# Patient Record
Sex: Female | Born: 1990 | Race: Black or African American | Hispanic: No | Marital: Single | Smoking: Current some day smoker
Health system: Southern US, Community
[De-identification: ages and names within clinical notes are randomized; demographics above are authoritative.]

---

## 2011-01-05 ENCOUNTER — Other Ambulatory Visit (HOSPITAL_COMMUNITY): Payer: Self-pay | Admitting: Family Medicine

## 2011-01-05 ENCOUNTER — Inpatient Hospital Stay (INDEPENDENT_AMBULATORY_CARE_PROVIDER_SITE_OTHER)
Admission: RE | Admit: 2011-01-05 | Discharge: 2011-01-05 | Disposition: A | Discharge status: Home / Self Care | Payer: Self-pay | Source: Ambulatory Visit | Attending: Emergency Medicine | Admitting: Emergency Medicine

## 2011-01-05 DIAGNOSIS — N760 Acute vaginitis: Secondary | ICD-10-CM

## 2011-01-05 LAB — WET PREP, GENITAL

## 2011-01-09 LAB — POCT PREGNANCY, URINE: Preg Test, Ur: NEGATIVE

## 2011-01-09 LAB — POCT URINALYSIS DIP (DEVICE)
Bilirubin Urine: NEGATIVE
Glucose, UA: NEGATIVE mg/dL
Nitrite: NEGATIVE
Specific Gravity, Urine: 1.015 (ref 1.005–1.030)
Urobilinogen, UA: 1 mg/dL (ref 0.0–1.0)

## 2011-06-27 ENCOUNTER — Inpatient Hospital Stay (INDEPENDENT_AMBULATORY_CARE_PROVIDER_SITE_OTHER)
Admission: RE | Admit: 2011-06-27 | Discharge: 2011-06-27 | Disposition: A | Discharge status: Home / Self Care | Payer: Medicaid Other | Source: Ambulatory Visit | Attending: Family Medicine | Admitting: Family Medicine

## 2011-06-27 DIAGNOSIS — B009 Herpesviral infection, unspecified: Secondary | ICD-10-CM

## 2011-06-27 DIAGNOSIS — N760 Acute vaginitis: Secondary | ICD-10-CM

## 2011-06-27 LAB — WET PREP, GENITAL
Trich, Wet Prep: NONE SEEN
Yeast Wet Prep HPF POC: NONE SEEN

## 2011-08-13 ENCOUNTER — Emergency Department (INDEPENDENT_AMBULATORY_CARE_PROVIDER_SITE_OTHER)
Admission: EM | Admit: 2011-08-13 | Discharge: 2011-08-13 | Disposition: A | Discharge status: Home / Self Care | Payer: Medicaid Other | Source: Home / Self Care | Attending: Family Medicine | Admitting: Family Medicine

## 2011-08-13 ENCOUNTER — Encounter: Payer: Self-pay | Admitting: *Deleted

## 2011-08-13 DIAGNOSIS — R21 Rash and other nonspecific skin eruption: Secondary | ICD-10-CM

## 2011-08-13 NOTE — ED Provider Notes (Signed)
History     CSN: [id number] Arrival date & time: 08/13/2011  1:53 PM   First MD Initiated Contact with Patient 08/13/11 1408      Chief Complaint  Patient presents with  . Rash    (Consider location/radiation/quality/duration/timing/severity/associated sxs/prior treatment) Patient is a 20 y.o. female presenting with rash. The history is provided by the patient.  Rash  This is a new problem. The current episode started 2 days ago. The problem has not changed since onset.The problem is associated with nothing. The rash is present on the right arm and left arm. The pain is at a severity of 0/10. Associated symptoms include itching. She has tried nothing for the symptoms.    History reviewed. No pertinent past medical history.  History reviewed. No pertinent past surgical history.  No family history on file.  History  Substance Use Topics  . Smoking status: Not on file  . Smokeless tobacco: Not on file  . Alcohol Use: Not on file    OB History    Grav Para Term Preterm Abortions TAB SAB Ect Mult Living                  Review of Systems  Constitutional: Negative.   Respiratory: Negative.   Cardiovascular: Negative.   Skin: Positive for itching and rash.    Allergies  Review of patient's allergies indicates no known allergies.  Home Medications   Current Outpatient Rx  Name Route Sig Dispense Refill  . MULTI-VITAMIN/MINERALS PO TABS Oral Take 1 tablet by mouth daily.        BP 115/69  Pulse 57  Temp(Src) 98.9 F (37.2 C) (Oral)  Resp 16  SpO2 100%  LMP 07/24/2011  Physical Exam  Constitutional: She appears well-developed and well-nourished.  Cardiovascular: Normal rate and regular rhythm.   Pulmonary/Chest: Breath sounds normal.  Skin: Skin is warm and dry.       Few small areas of dry skin    ED Course  Procedures (including critical care time)  Labs Reviewed - No data to display No results found.   No diagnosis found.    MDM           Randa Spike, MD 08/13/11 [phone]

## 2012-02-14 ENCOUNTER — Emergency Department (HOSPITAL_COMMUNITY)
Admission: EM | Admit: 2012-02-14 | Discharge: 2012-02-14 | Disposition: A | Discharge status: Home / Self Care | Payer: Medicaid Other | Source: Home / Self Care | Attending: Emergency Medicine | Admitting: Emergency Medicine

## 2013-10-27 ENCOUNTER — Emergency Department (HOSPITAL_COMMUNITY)
Admission: EM | Admit: 2013-10-27 | Discharge: 2013-10-27 | Disposition: A | Discharge status: Home / Self Care | Payer: BC Managed Care – PPO | Attending: Emergency Medicine | Admitting: Emergency Medicine

## 2013-10-27 ENCOUNTER — Encounter (HOSPITAL_COMMUNITY): Payer: Self-pay | Admitting: Emergency Medicine

## 2013-10-27 ENCOUNTER — Emergency Department (HOSPITAL_COMMUNITY): Payer: BC Managed Care – PPO

## 2013-10-27 DIAGNOSIS — R079 Chest pain, unspecified: Secondary | ICD-10-CM

## 2013-10-27 DIAGNOSIS — R071 Chest pain on breathing: Secondary | ICD-10-CM | POA: Insufficient documentation

## 2013-10-27 DIAGNOSIS — IMO0002 Reserved for concepts with insufficient information to code with codable children: Secondary | ICD-10-CM | POA: Insufficient documentation

## 2013-10-27 DIAGNOSIS — F17200 Nicotine dependence, unspecified, uncomplicated: Secondary | ICD-10-CM | POA: Insufficient documentation

## 2013-10-27 DIAGNOSIS — M542 Cervicalgia: Secondary | ICD-10-CM | POA: Insufficient documentation

## 2013-10-27 DIAGNOSIS — Y92410 Unspecified street and highway as the place of occurrence of the external cause: Secondary | ICD-10-CM | POA: Insufficient documentation

## 2013-10-27 DIAGNOSIS — Y939 Activity, unspecified: Secondary | ICD-10-CM | POA: Insufficient documentation

## 2013-10-27 MED ORDER — IBUPROFEN 800 MG PO TABS: 800.0000 mg | ORAL_TABLET | Freq: Three times a day (TID) | ORAL | Status: DC

## 2013-10-27 MED ORDER — METHOCARBAMOL 500 MG PO TABS: 500.0000 mg | ORAL_TABLET | Freq: Two times a day (BID) | ORAL | Status: DC | PRN
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
## 2013-10-27 NOTE — Discharge Instructions (Signed)
Take the prescribed medication as directed.  You may continue to be sore for the next few days. May wish to apply heat to affected areas to help ease muscle soreness. Return to the ED for new or worsening symptoms.
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
## 2013-10-28 NOTE — ED Provider Notes (Signed)
Medical screening examination/treatment/procedure(s) were performed by non-physician practitioner and as supervising physician I was immediately available for consultation/collaboration.  EKG Interpretation   None         Antaeus Karel S Takuya Lariccia, MD 10/28/13 1027 

## 2013-11-14 ENCOUNTER — Emergency Department (HOSPITAL_COMMUNITY)
Admission: EM | Admit: 2013-11-14 | Discharge: 2013-11-14 | Disposition: A | Discharge status: Home / Self Care | Payer: BC Managed Care – PPO | Attending: Emergency Medicine | Admitting: Emergency Medicine

## 2013-11-14 ENCOUNTER — Encounter (HOSPITAL_COMMUNITY): Payer: Self-pay | Admitting: Emergency Medicine

## 2013-11-14 ENCOUNTER — Emergency Department (HOSPITAL_COMMUNITY): Payer: BC Managed Care – PPO

## 2013-11-14 DIAGNOSIS — R079 Chest pain, unspecified: Secondary | ICD-10-CM | POA: Insufficient documentation

## 2013-11-14 DIAGNOSIS — F17200 Nicotine dependence, unspecified, uncomplicated: Secondary | ICD-10-CM | POA: Insufficient documentation

## 2013-11-14 DIAGNOSIS — R112 Nausea with vomiting, unspecified: Secondary | ICD-10-CM | POA: Insufficient documentation

## 2013-11-14 DIAGNOSIS — Z3202 Encounter for pregnancy test, result negative: Secondary | ICD-10-CM | POA: Insufficient documentation

## 2013-11-14 DIAGNOSIS — Z792 Long term (current) use of antibiotics: Secondary | ICD-10-CM | POA: Insufficient documentation

## 2013-11-14 LAB — CBC WITH DIFFERENTIAL/PLATELET
Basophils Absolute: 0 10*3/uL (ref 0.0–0.1)
Basophils Relative: 0 % (ref 0–1)
EOS ABS: 0.1 10*3/uL (ref 0.0–0.7)
EOS PCT: 1 % (ref 0–5)
HEMATOCRIT: 35.9 % — AB (ref 36.0–46.0)
Hemoglobin: 12.1 g/dL (ref 12.0–15.0)
LYMPHS ABS: 1.4 10*3/uL (ref 0.7–4.0)
LYMPHS PCT: 18 % (ref 12–46)
MCH: 28.5 pg (ref 26.0–34.0)
MCHC: 33.7 g/dL (ref 30.0–36.0)
MCV: 84.5 fL (ref 78.0–100.0)
MONO ABS: 0.5 10*3/uL (ref 0.1–1.0)
Monocytes Relative: 6 % (ref 3–12)
Neutro Abs: 5.5 10*3/uL (ref 1.7–7.7)
Neutrophils Relative %: 75 % (ref 43–77)
PLATELETS: 308 10*3/uL (ref 150–400)
RBC: 4.25 MIL/uL (ref 3.87–5.11)
RDW: 15.1 % (ref 11.5–15.5)
WBC: 7.4 10*3/uL (ref 4.0–10.5)

## 2013-11-14 LAB — COMPREHENSIVE METABOLIC PANEL
ALT: 13 U/L (ref 0–35)
AST: 22 U/L (ref 0–37)
Albumin: 3.5 g/dL (ref 3.5–5.2)
Alkaline Phosphatase: 42 U/L (ref 39–117)
BUN: 15 mg/dL (ref 6–23)
CALCIUM: 8.9 mg/dL (ref 8.4–10.5)
CO2: 25 meq/L (ref 19–32)
CREATININE: 0.82 mg/dL (ref 0.50–1.10)
Chloride: 100 mEq/L (ref 96–112)
GLUCOSE: 92 mg/dL (ref 70–99)
Potassium: 4.1 mEq/L (ref 3.7–5.3)
Sodium: 137 mEq/L (ref 137–147)
TOTAL PROTEIN: 7.6 g/dL (ref 6.0–8.3)
Total Bilirubin: 0.2 mg/dL — ABNORMAL LOW (ref 0.3–1.2)

## 2013-11-14 LAB — URINALYSIS, ROUTINE W REFLEX MICROSCOPIC
Bilirubin Urine: NEGATIVE
Glucose, UA: NEGATIVE mg/dL
HGB URINE DIPSTICK: NEGATIVE
Ketones, ur: NEGATIVE mg/dL
NITRITE: NEGATIVE
PROTEIN: NEGATIVE mg/dL
SPECIFIC GRAVITY, URINE: 1.005 (ref 1.005–1.030)
UROBILINOGEN UA: 0.2 mg/dL (ref 0.0–1.0)
pH: 7.5 (ref 5.0–8.0)

## 2013-11-14 LAB — URINE MICROSCOPIC-ADD ON

## 2013-11-14 LAB — POCT PREGNANCY, URINE: PREG TEST UR: NEGATIVE

## 2013-11-14 LAB — LIPASE, BLOOD: LIPASE: 23 U/L (ref 11–59)

## 2013-11-14 MED ORDER — SODIUM CHLORIDE 0.9 % IV BOLUS (SEPSIS)
1000.0000 mL | Freq: Once | INTRAVENOUS | Status: AC
  Administered 2013-11-14: 1000 mL via INTRAVENOUS

## 2013-11-14 MED ORDER — MORPHINE SULFATE 4 MG/ML IJ SOLN
4.0000 mg | Freq: Once | INTRAMUSCULAR | Status: AC
  Administered 2013-11-14: 4 mg via INTRAVENOUS
  Filled 2013-11-14: qty 1

## 2013-11-14 MED ORDER — ONDANSETRON HCL 4 MG PO TABS: 4.0000 mg | ORAL_TABLET | Freq: Four times a day (QID) | ORAL | Status: AC

## 2013-11-14 MED ORDER — ONDANSETRON HCL 4 MG/2ML IJ SOLN
4.0000 mg | Freq: Once | INTRAMUSCULAR | Status: AC
  Administered 2013-11-14: 4 mg via INTRAVENOUS
  Filled 2013-11-14: qty 2

## 2013-11-14 NOTE — Discharge Instructions (Signed)
Chest Pain (Nonspecific) °It is often hard to give a specific diagnosis for the cause of chest pain. There is always a chance that your pain could be related to something serious, such as a heart attack or a blood clot in the lungs. You need to follow up with your caregiver for further evaluation. °CAUSES  °· Heartburn. °· Pneumonia or bronchitis. °· Anxiety or stress. °· Inflammation around your heart (pericarditis) or lung (pleuritis or pleurisy). °· A blood clot in the lung. °· A collapsed lung (pneumothorax). It can develop suddenly on its own (spontaneous pneumothorax) or from injury (trauma) to the chest. °· Shingles infection (herpes zoster virus). °The chest wall is composed of bones, muscles, and cartilage. Any of these can be the source of the pain. °· The bones can be bruised by injury. °· The muscles or cartilage can be strained by coughing or overwork. °· The cartilage can be affected by inflammation and become sore (costochondritis). °DIAGNOSIS  °Lab tests or other studies, such as X-rays, electrocardiography, stress testing, or cardiac imaging, may be needed to find the cause of your pain.  °TREATMENT  °· Treatment depends on what may be causing your chest pain. Treatment may include: °· Acid blockers for heartburn. °· Anti-inflammatory medicine. °· Pain medicine for inflammatory conditions. °· Antibiotics if an infection is present. °· You may be advised to change lifestyle habits. This includes stopping smoking and avoiding alcohol, caffeine, and chocolate. °· You may be advised to keep your head raised (elevated) when sleeping. This reduces the chance of acid going backward from your stomach into your esophagus. °· Most of the time, nonspecific chest pain will improve within 2 to 3 days with rest and mild pain medicine. °HOME CARE INSTRUCTIONS  °· If antibiotics were prescribed, take your antibiotics as directed. Finish them even if you start to feel better. °· For the next few days, avoid physical  activities that bring on chest pain. Continue physical activities as directed. °· Do not smoke. °· Avoid drinking alcohol. °· Only take over-the-counter or prescription medicine for pain, discomfort, or fever as directed by your caregiver. °· Follow your caregiver's suggestions for further testing if your chest pain does not go away. °· Keep any follow-up appointments you made. If you do not go to an appointment, you could develop lasting (chronic) problems with pain. If there is any problem keeping an appointment, you must call to reschedule. °SEEK MEDICAL CARE IF:  °· You think you are having problems from the medicine you are taking. Read your medicine instructions carefully. °· Your chest pain does not go away, even after treatment. °· You develop a rash with blisters on your chest. °SEEK IMMEDIATE MEDICAL CARE IF:  °· You have increased chest pain or pain that spreads to your arm, neck, jaw, back, or abdomen. °· You develop shortness of breath, an increasing cough, or you are coughing up blood. °· You have severe back or abdominal pain, feel nauseous, or vomit. °· You develop severe weakness, fainting, or chills. °· You have a fever. °THIS IS AN EMERGENCY. Do not wait to see if the pain will go away. Get medical help at once. Call your local emergency services (911 in [place] Do not drive yourself to the hospital. °MAKE SURE YOU:  °· Understand these instructions. °· Will watch your condition. °· Will get help right away if you are not doing well or get worse. °Document Released: 07/05/2005 Document Revised: 12/18/2011 Document Reviewed: 04/30/2008 °ExitCare® Patient Information ©2014 [employer],   LLC. Viral Gastroenteritis Viral gastroenteritis is also known as stomach flu. This condition affects the stomach and intestinal tract. It can cause sudden diarrhea and vomiting. The illness typically lasts 3 to 8 days. Most people develop an immune response that eventually gets rid of the virus. While this natural  response develops, the virus can make you quite ill. CAUSES  Many different viruses can cause gastroenteritis, such as rotavirus or noroviruses. You can catch one of these viruses by consuming contaminated food or water. You may also catch a virus by sharing utensils or other personal items with an infected person or by touching a contaminated surface. SYMPTOMS  The most common symptoms are diarrhea and vomiting. These problems can cause a severe loss of body fluids (dehydration) and a body salt (electrolyte) imbalance. Other symptoms may include:  Fever.  Headache.  Fatigue.  Abdominal pain. DIAGNOSIS  Your caregiver can usually diagnose viral gastroenteritis based on your symptoms and a physical exam. A stool sample may also be taken to test for the presence of viruses or other infections. TREATMENT  This illness typically goes away on its own. Treatments are aimed at rehydration. The most serious cases of viral gastroenteritis involve vomiting so severely that you are not able to keep fluids down. In these cases, fluids must be given through an intravenous line (IV). HOME CARE INSTRUCTIONS   Drink enough fluids to keep your urine clear or pale yellow. Drink small amounts of fluids frequently and increase the amounts as tolerated.  Ask your caregiver for specific rehydration instructions.  Avoid:  Foods high in sugar.  Alcohol.  Carbonated drinks.  Tobacco.  Juice.  Caffeine drinks.  Extremely hot or cold fluids.  Fatty, greasy foods.  Too much intake of anything at one time.  Dairy products until 24 to 48 hours after diarrhea stops.  You may consume probiotics. Probiotics are active cultures of beneficial bacteria. They may lessen the amount and number of diarrheal stools in adults. Probiotics can be found in yogurt with active cultures and in supplements.  Wash your hands well to avoid spreading the virus.  Only take over-the-counter or prescription medicines for  pain, discomfort, or fever as directed by your caregiver. Do not give aspirin to children. Antidiarrheal medicines are not recommended.  Ask your caregiver if you should continue to take your regular prescribed and over-the-counter medicines.  Keep all follow-up appointments as directed by your caregiver. SEEK IMMEDIATE MEDICAL CARE IF:   You are unable to keep fluids down.  You do not urinate at least once every 6 to 8 hours.  You develop shortness of breath.  You notice blood in your stool or vomit. This may look like coffee grounds.  You have abdominal pain that increases or is concentrated in one small area (localized).  You have persistent vomiting or diarrhea.  You have a fever.  The patient is a child younger than 3 months, and he or she has a fever.  The patient is a child older than 3 months, and he or she has a fever and persistent symptoms.  The patient is a child older than 3 months, and he or she has a fever and symptoms suddenly get worse.  The patient is a baby, and he or she has no tears when crying. MAKE SURE YOU:   Understand these instructions.  Will watch your condition.  Will get help right away if you are not doing well or get worse. Document Released: 09/25/2005 Document Revised: 12/18/2011 Document  Reviewed: 07/12/2011 ExitCare Patient Information 2014 [place].
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
## 2013-11-14 NOTE — ED Notes (Signed)
Rob, PA back at the bedside.

## 2013-11-14 NOTE — ED Notes (Signed)
Pt states she did karate on Monday and felt fine afterwards. Also reports some mid abd pain which is bothering her right now. Denies any cp at present

## 2013-11-14 NOTE — ED Provider Notes (Signed)
Medical screening examination/treatment/procedure(s) were performed by non-physician practitioner and as supervising physician I was immediately available for consultation/collaboration.  EKG Interpretation    Date/Time:  Friday November 14 2013 10:34:00 EST Ventricular Rate:  72 PR Interval:  146 QRS Duration: 72 QT Interval:  384 QTC Calculation: 420 R Axis:   79 Text Interpretation:  Normal sinus rhythm with sinus arrhythmia Septal infarct , age undetermined Abnormal ECG Confirmed by WARD  DO, KRISTEN ([id number]) on 11/14/2013 10:43:04 AM              Layla MawKristen N Ward, DO 11/14/13 1452

## 2013-11-14 NOTE — ED Notes (Signed)
Patient states she started having chest pain x 3 days ago.  Patient stated central chest pain with no radiation.  Patient states she was dizzy and has had N/V.

## 2013-11-15 LAB — URINE CULTURE
Colony Count: NO GROWTH
Culture: NO GROWTH

## 2015-09-07 IMAGING — CR DG CHEST 2V
2 series · 2 of 2 positions shown · non-contrast
Comparison: None.

CLINICAL DATA: Chest pain

EXAM:
CHEST  2 VIEW

[w chest pa]
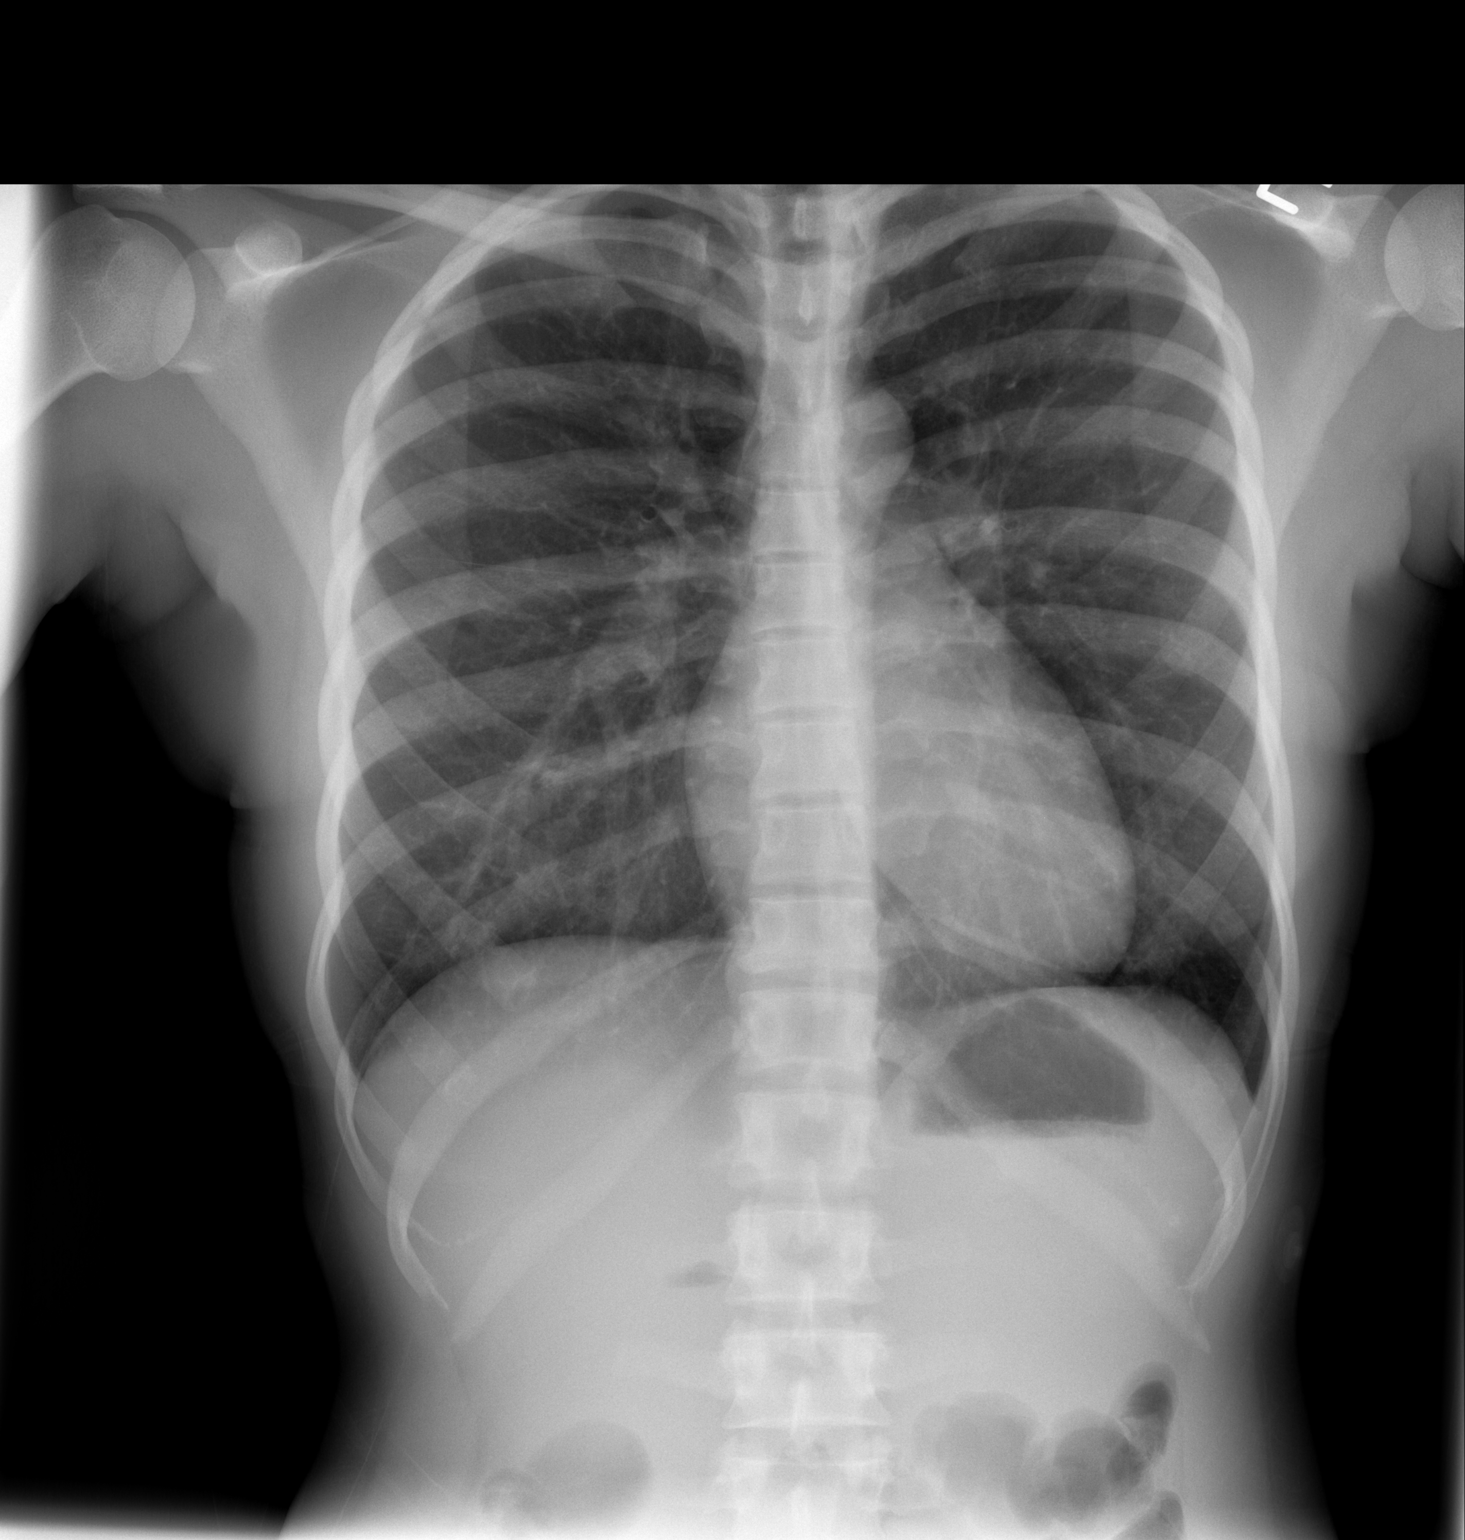

[w chest lat]
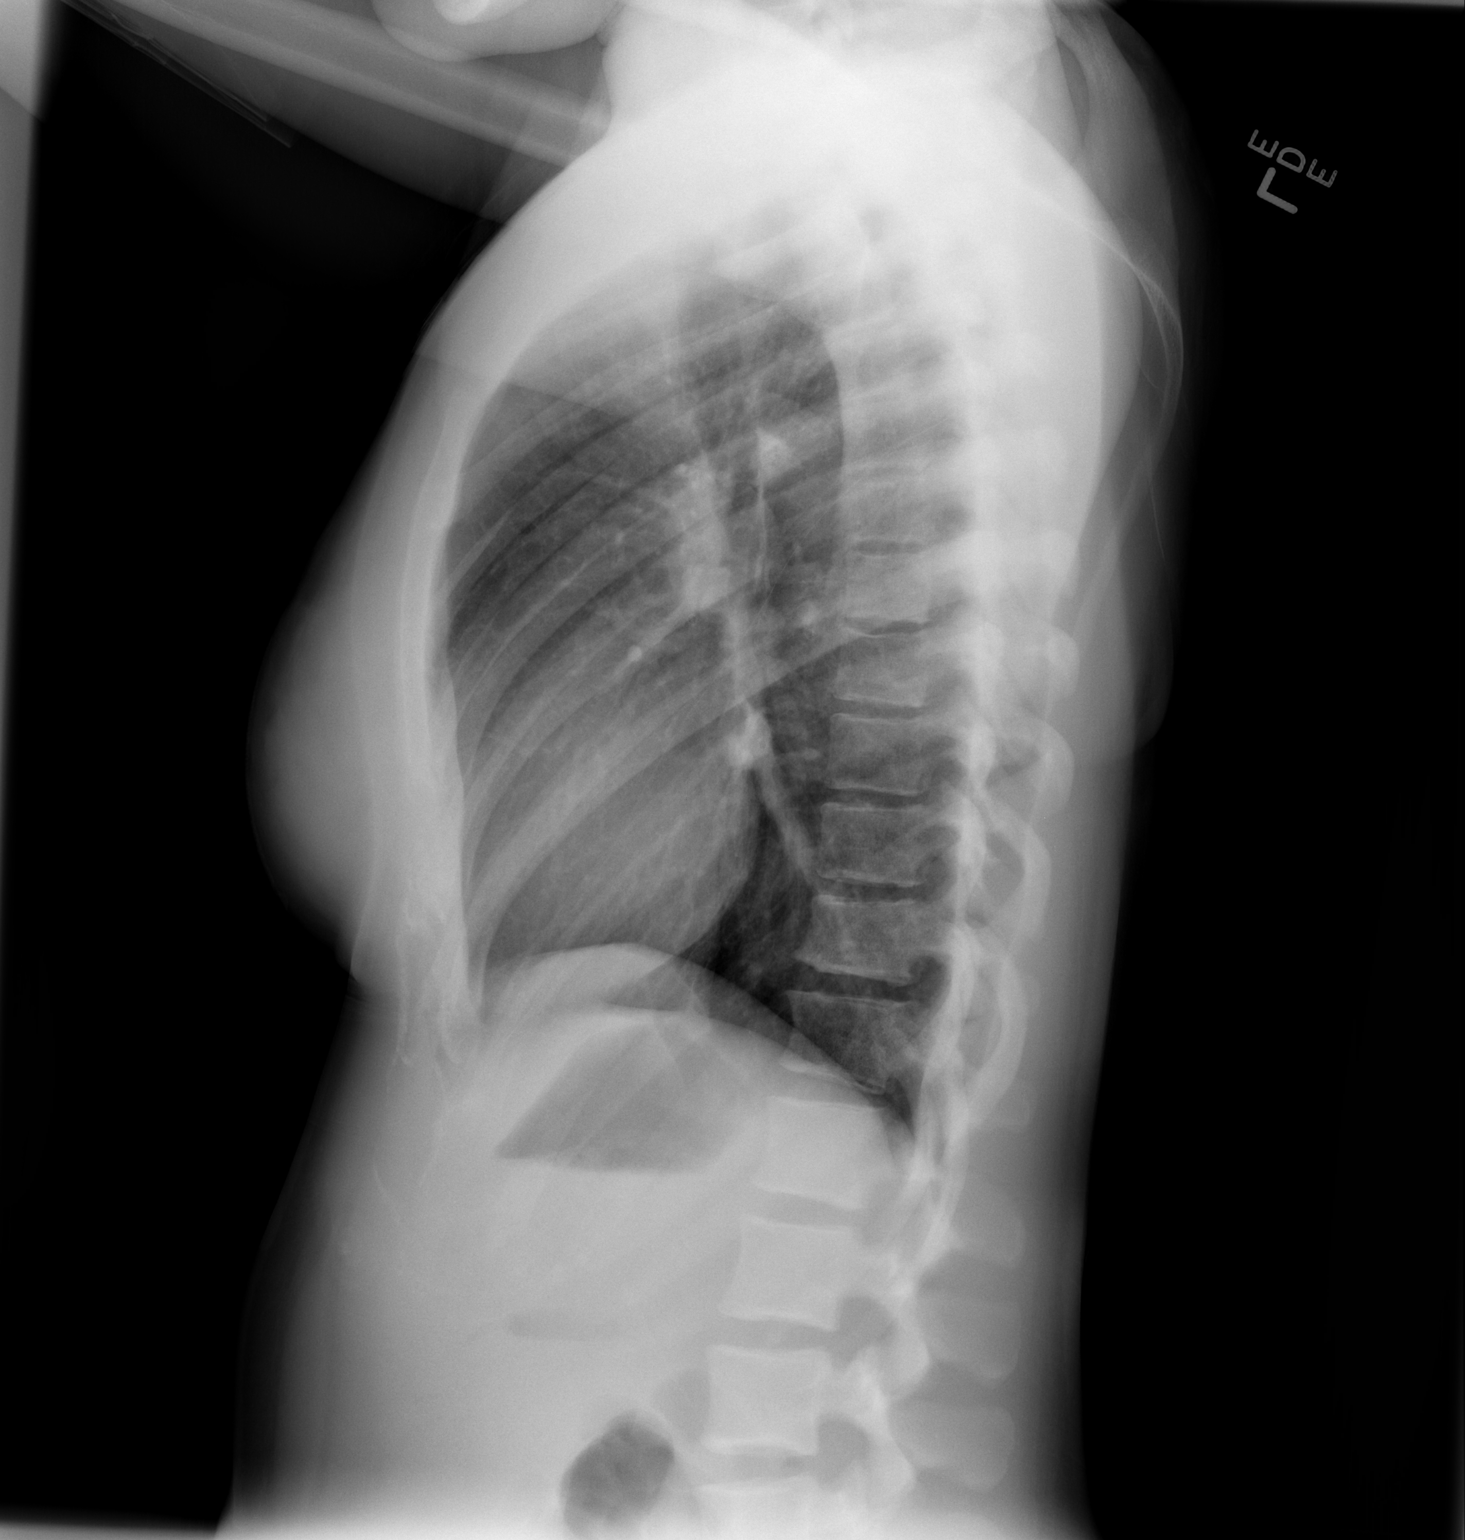

[2 of 2 positions shown; findings below may reference images not displayed]

FINDINGS: Lungs are clear. Heart size and pulmonary vascularity are normal. No
adenopathy. No pneumothorax. No bone lesions.
IMPRESSION: No abnormality noted.

## 2015-09-25 IMAGING — CR DG CHEST 2V
2 series · 2 of 2 positions shown · non-contrast
Comparison: 10/27/2013

CLINICAL DATA: Chest pain

EXAM:
CHEST  2 VIEW

[w chest pa]
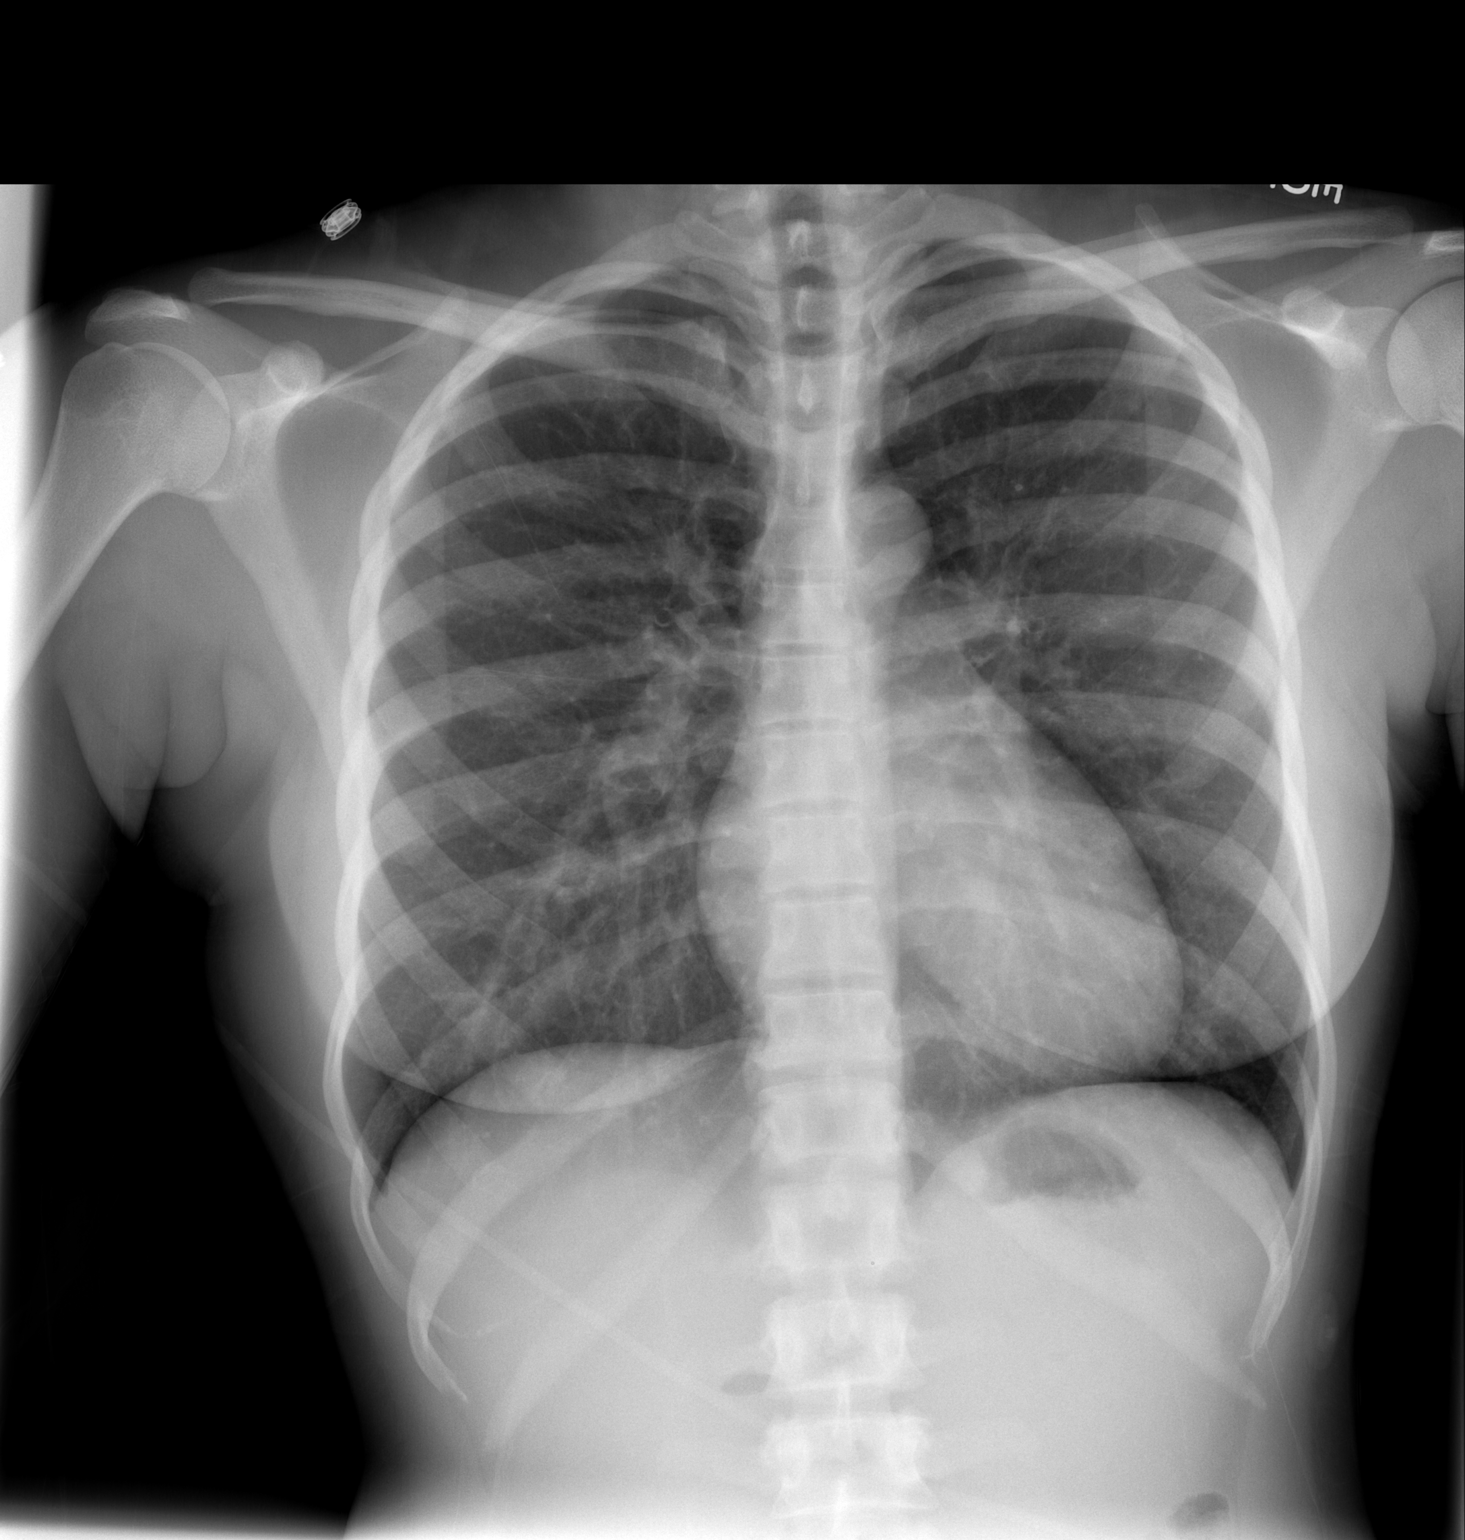

[w chest lat]
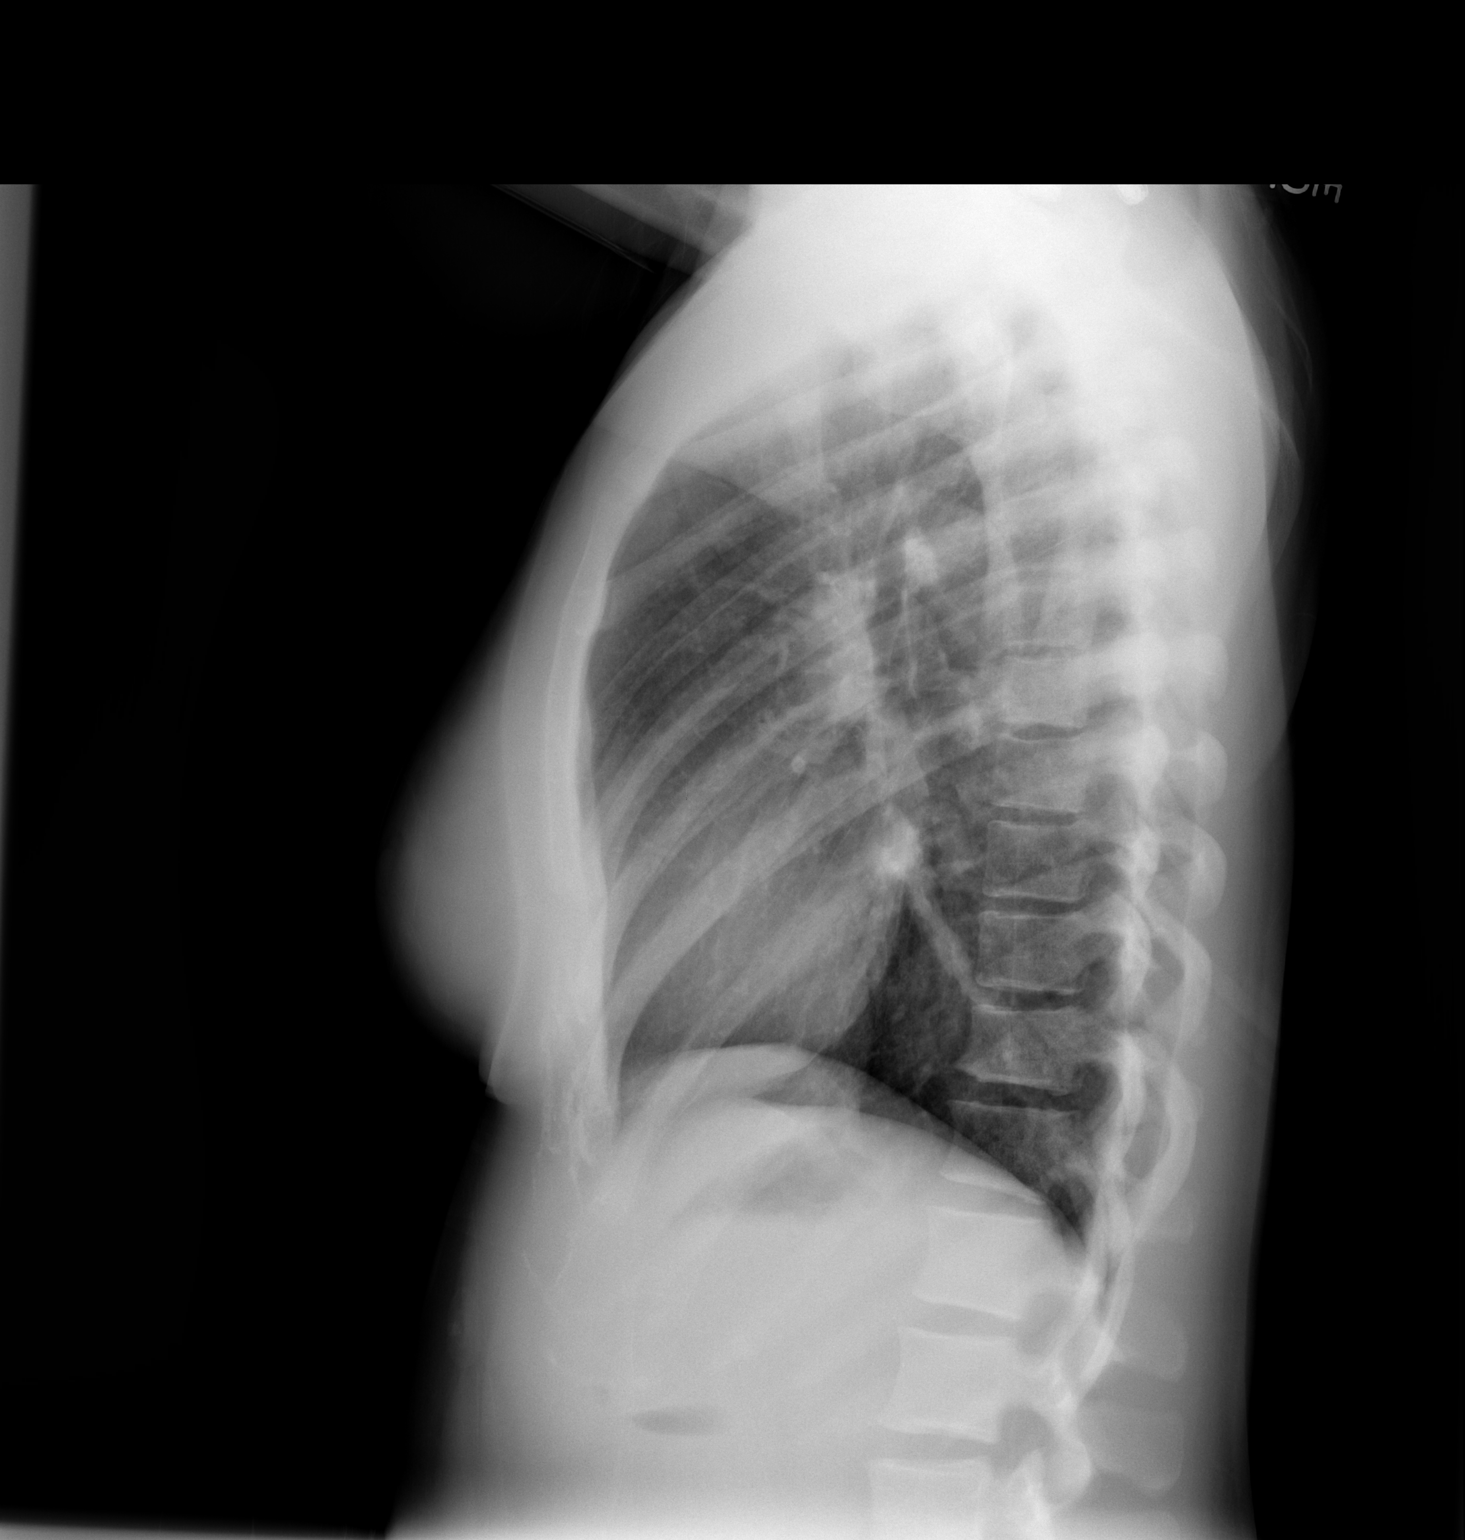

[2 of 2 positions shown; findings below may reference images not displayed]

FINDINGS: The heart size and mediastinal contours are within normal limits.
Both lungs are clear. The visualized skeletal structures are
unremarkable.
IMPRESSION: No active cardiopulmonary disease.

## 2017-02-01 ENCOUNTER — Encounter: Attending: Internal Medicine | Primary: Family

## 2017-02-14 ENCOUNTER — Inpatient Hospital Stay: Admit: 2017-02-15 | Payer: BLUE CROSS/BLUE SHIELD | Primary: Family

## 2017-02-14 ENCOUNTER — Ambulatory Visit: Admit: 2017-02-14 | Discharge: 2017-02-14 | Payer: PRIVATE HEALTH INSURANCE | Attending: Family | Primary: Family

## 2017-02-14 DIAGNOSIS — Z01419 Encounter for gynecological examination (general) (routine) without abnormal findings: Secondary | ICD-10-CM

## 2017-02-14 DIAGNOSIS — Z124 Encounter for screening for malignant neoplasm of cervix: Secondary | ICD-10-CM

## 2017-02-14 NOTE — Progress Notes (Signed)
Chief Complaint   Patient presents with   ??? Establish Care   ??? Well Woman     Pt here to establish care with provider.     Pt would like to have well woman exam with PAP

## 2017-02-14 NOTE — Progress Notes (Signed)
Normal pap- repeat in 3 years.

## 2017-02-14 NOTE — Patient Instructions (Addendum)
Well Visit, Ages 18 to 50: Care Instructions  Your Care Instructions    Physical exams can help you stay healthy. Your doctor has checked your overall health and may have suggested ways to take good care of yourself. He or she also may have recommended tests. At home, you can help prevent illness with healthy eating, regular exercise, and other steps.  Follow-up care is a key part of your treatment and safety. Be sure to make and go to all appointments, and call your doctor if you are having problems. It's also a good idea to know your test results and keep a list of the medicines you take.  How can you care for yourself at home?  ?? Reach and stay at a healthy weight. This will lower your risk for many problems, such as obesity, diabetes, heart disease, and high blood pressure.  ?? Get at least 30 minutes of physical activity on most days of the week. Walking is a good choice. You also may want to do other activities, such as running, swimming, cycling, or playing tennis or team sports. Discuss any changes in your exercise program with your doctor.  ?? Do not smoke or allow others to smoke around you. If you need help quitting, talk to your doctor about stop-smoking programs and medicines. These can increase your chances of quitting for good.  ?? Talk to your doctor about whether you have any risk factors for sexually transmitted infections (STIs). Having one sex partner (who does not have STIs and does not have sex with anyone else) is a good way to avoid these infections.  ?? Use birth control if you do not want to have children at this time. Talk with your doctor about the choices available and what might be best for you.  ?? Protect your skin from too much sun. When you're outdoors from 10 a.m. to 4 p.m., stay in the shade or cover up with clothing and a hat with a wide brim. Wear sunglasses that block UV rays. Even when it's cloudy, put broad-spectrum sunscreen (SPF 30 or higher) on any exposed skin.   ?? See a dentist one or two times a year for checkups and to have your teeth cleaned.  ?? Wear a seat belt in the car.  ?? Drink alcohol in moderation, if at all. That means no more than 2 drinks a day for men and 1 drink a day for women.  Follow your doctor's advice about when to have certain tests. These tests can spot problems early.  For everyone  ?? Cholesterol. Have the fat (cholesterol) in your blood tested after age 20. Your doctor will tell you how often to have this done based on your age, family history, or other things that can increase your risk for heart disease.  ?? Blood pressure. Have your blood pressure checked during a routine doctor visit. Your doctor will tell you how often to check your blood pressure based on your age, your blood pressure results, and other factors.  ?? Vision. Talk with your doctor about how often to have a glaucoma test.  ?? Diabetes. Ask your doctor whether you should have tests for diabetes.  ?? Colon cancer. Have a test for colon cancer at age 50. You may have one of several tests. If you are younger than 50, you may need a test earlier if you have any risk factors. Risk factors include whether you already had a precancerous polyp removed from your colon or whether your   parent, brother, sister, or child has had colon cancer.  For women  ?? Breast exam and mammogram. Talk to your doctor about when you should have a clinical breast exam and a mammogram. Medical experts differ on whether and how often women under 50 should have these tests. Your doctor can help you decide what is right for you.  ?? Pap test and pelvic exam. Begin Pap tests at age 21. A Pap test is the best way to find cervical cancer. The test often is part of a pelvic exam. Ask how often to have this test.  ?? Tests for sexually transmitted infections (STIs). Ask whether you should have tests for STIs. You may be at risk if you have sex with more than one person, especially if your partners do not wear condoms.   For men  ?? Tests for sexually transmitted infections (STIs). Ask whether you should have tests for STIs. You may be at risk if you have sex with more than one person, especially if you do not wear a condom.  ?? Testicular cancer exam. Ask your doctor whether you should check your testicles regularly.  ?? Prostate exam. Talk to your doctor about whether you should have a blood test (called a PSA test) for prostate cancer. Experts differ on whether and when men should have this test. Some experts suggest it if you are older than 45 and are African-American or have a father or brother who got prostate cancer when he was younger than 65.  When should you call for help?  Watch closely for changes in your health, and be sure to contact your doctor if you have any problems or symptoms that concern you.  Where can you learn more?  Go to http://www.healthwise.net/[employer].  Enter P072 in the search box to learn more about "Well Visit, Ages 18 to 50: Care Instructions."  Current as of: Feb 18, 2016  Content Version: 11.4  ?? 2006-2017 [employer]. Care instructions adapted under license by [employer] (which disclaims liability or warranty for this information). If you have questions about a medical condition or this instruction, always ask your healthcare professional. [employer] disclaims any warranty or liability for your use of this information.

## 2017-02-15 LAB — VITAMIN D, 25 HYDROXY: VITAMIN D, 25-HYDROXY: 32.1 ng/mL (ref 30.0–100.0)

## 2017-02-15 LAB — METABOLIC PANEL, COMPREHENSIVE
A-G Ratio: 1.5 (ref 1.2–2.2)
ALT (SGPT): 14 IU/L (ref 0–32)
AST (SGOT): 18 IU/L (ref 0–40)
Albumin: 4.3 g/dL (ref 3.5–5.5)
Alk. phosphatase: 38 IU/L — ABNORMAL LOW (ref 39–117)
BUN/Creatinine ratio: 18 (ref 9–23)
BUN: 16 mg/dL (ref 6–20)
Bilirubin, total: 0.7 mg/dL (ref 0.0–1.2)
CO2: 24 mmol/L (ref 18–29)
Calcium: 8.8 mg/dL (ref 8.7–10.2)
Chloride: 101 mmol/L (ref 96–106)
Creatinine: 0.9 mg/dL (ref 0.57–1.00)
GFR est AA: 103 mL/min/{1.73_m2} (ref >59)
GFR est non-AA: 89 mL/min/{1.73_m2} (ref >59)
GLOBULIN, TOTAL: 2.9 g/dL (ref 1.5–4.5)
Glucose: 84 mg/dL (ref 65–99)
Potassium: 4 mmol/L (ref 3.5–5.2)
Protein, total: 7.2 g/dL (ref 6.0–8.5)
Sodium: 139 mmol/L (ref 134–144)

## 2017-02-15 LAB — CBC WITH AUTOMATED DIFF
ABS. BASOPHILS: 0 10*3/uL (ref 0.0–0.2)
ABS. EOSINOPHILS: 0 10*3/uL (ref 0.0–0.4)
ABS. IMM. GRANS.: 0 10*3/uL (ref 0.0–0.1)
ABS. MONOCYTES: 0.3 10*3/uL (ref 0.1–0.9)
ABS. NEUTROPHILS: 2.7 10*3/uL (ref 1.4–7.0)
Abs Lymphocytes: 1.6 10*3/uL (ref 0.7–3.1)
BASOPHILS: 0 %
EOSINOPHILS: 0 %
HCT: 37.8 % (ref 34.0–46.6)
HGB: 12.1 g/dL (ref 11.1–15.9)
IMMATURE GRANULOCYTES: 0 %
Lymphocytes: 35 %
MCH: 27.9 pg (ref 26.6–33.0)
MCHC: 32 g/dL (ref 31.5–35.7)
MCV: 87 fL (ref 79–97)
MONOCYTES: 7 %
NEUTROPHILS: 58 %
PLATELET: 258 10*3/uL (ref 150–379)
RBC: 4.34 x10E6/uL (ref 3.77–5.28)
RDW: 14.4 % (ref 12.3–15.4)
WBC: 4.7 10*3/uL (ref 3.4–10.8)

## 2017-02-15 LAB — CVD REPORT

## 2017-02-15 LAB — LIPID PANEL
Cholesterol, total: 149 mg/dL (ref 100–199)
HDL Cholesterol: 57 mg/dL (ref >39)
LDL, calculated: 79 mg/dL (ref 0–99)
Triglyceride: 64 mg/dL (ref 0–149)
VLDL, calculated: 13 mg/dL (ref 5–40)

## 2017-02-15 NOTE — Progress Notes (Signed)
Spoke with patient and advised of normal labs. Patient verbalized understanding and had no questions at this time.

## 2017-02-15 NOTE — Progress Notes (Signed)
Blood work all normal. Recheck 1 year.

## 2017-02-18 LAB — NUSWAB VAGINITIS PLUS
C. albicans, NAA: NEGATIVE
C. glabrata, NAA: NEGATIVE
C. trachomatis, NAA: NEGATIVE
N. gonorrhoeae, NAA: NEGATIVE
T. vaginalis, NAA: NEGATIVE

## 2017-02-19 NOTE — Progress Notes (Signed)
Normal vaginal swab.

## 2017-02-26 NOTE — Progress Notes (Signed)
Subjective:   26 y.o. female to establish care and for Well Woman Check.       There is no problem list on file for this patient.    Not on File  History reviewed. No pertinent past medical history.  History reviewed. No pertinent surgical history.  Family History   Problem Relation Age of Onset   ??? No Known Problems Mother      Social History   Substance Use Topics   ??? Smoking status: Former Smoker     Types: Cigarettes     Quit date: 2018   ??? Smokeless tobacco: Never Used   ??? Alcohol use Yes      Comment: socially        Lab Results  Component Value Date/Time   WBC 4.7 02/14/2017 10:48 AM   HGB 12.1 02/14/2017 10:48 AM   HCT 37.8 02/14/2017 10:48 AM   PLATELET 258 02/14/2017 10:48 AM   MCV 87 02/14/2017 10:48 AM     Lab Results  Component Value Date/Time   Cholesterol, total 149 02/14/2017 10:48 AM   HDL Cholesterol 57 02/14/2017 10:48 AM   LDL, calculated 79 02/14/2017 10:48 AM   Triglyceride 64 02/14/2017 10:48 AM     Lab Results  Component Value Date/Time   ALT (SGPT) 14 02/14/2017 10:48 AM   AST (SGOT) 18 02/14/2017 10:48 AM   Alk. phosphatase 38 (L) 02/14/2017 10:48 AM   Bilirubin, total 0.7 02/14/2017 10:48 AM   Albumin 4.3 02/14/2017 10:48 AM   Protein, total 7.2 02/14/2017 10:48 AM   PLATELET 258 02/14/2017 10:48 AM       Lab Results  Component Value Date/Time   GFR est non-AA 89 02/14/2017 10:48 AM   GFR est AA 103 02/14/2017 10:48 AM   Creatinine 0.90 02/14/2017 10:48 AM   BUN 16 02/14/2017 10:48 AM   Sodium 139 02/14/2017 10:48 AM   Potassium 4.0 02/14/2017 10:48 AM   Chloride 101 02/14/2017 10:48 AM   CO2 24 02/14/2017 10:48 AM        ROS: Feeling generally well. No TIA's or unusual headaches, no dysphagia.  No prolonged cough. No dyspnea or chest pain on exertion.  No abdominal pain, change in bowel habits, black or bloody stools.  No urinary tract symptoms.  No new or unusual musculoskeletal symptoms.    Specific concerns today: c/o pain in right knee, present for several  months, with associated tenderness, stiffness, "giving out." symptoms aggravated by activity, alleviated somewhat by rest. Has taken time off from athletic activities to see if this helps, but symptoms return each time she trys to resume her activities.     Objective:   The patient appears well, alert, oriented x 3, in no distress.  Visit Vitals   ??? BP 124/76   ??? Pulse 84   ??? Temp 98.3 ??F (36.8 ??C) (Oral)   ??? Resp 18   ??? Ht 5' (1.524 m)   ??? Wt 138 lb 6.4 oz (62.8 kg)   ??? LMP 01/21/2017 (Exact Date)   ??? SpO2 98%   ??? BMI 27.03 kg/m2     ENT normal.  Neck supple. No adenopathy or thyromegaly. PERLA. Lungs are clear, good air entry, no wheezes, rhonchi or rales. S1 and S2 normal, no murmurs, regular rate and rhythm. Abdomen soft without tenderness, guarding, mass or organomegaly. Extremities show no edema, normal peripheral pulses. Neurological is normal, no focal findings. Right knee diffuse anterior tenderness, no ecchymosis, no swelling or effusion, no  erythema.    Breasts: breasts appear normal, no suspicious masses, no skin or nipple changes or axillary nodes    Pelvic exam: VULVA: normal appearing vulva with no masses, tenderness or lesions, VAGINA: normal appearing vagina with normal color and discharge, no lesions, vaginal discharge - copious, milky and odorless, CERVIX: normal appearing cervix without discharge or lesions, cervical motion tenderness absent, UTERUS: uterus is normal size, shape, consistency and nontender, ADNEXA: normal adnexa in size, nontender and no masses, exam chaperoned by O. Hall LPN.  Marland Kitchen      Assessment/Plan:   Well Woman  lose weight, increase physical activity, limit alcohol consumption, follow low fat diet, follow low salt diet, routine labs ordered  Diagnoses and all orders for this visit:    1. Well woman exam with routine gynecological exam  -     CBC WITH AUTOMATED DIFF  -     METABOLIC PANEL, COMPREHENSIVE  -     VITAMIN D, 25 HYDROXY     2. Screening for malignant neoplasm of cervix  -     PAP IG, RFX HPV ASCU, 16&18,45([id number])    3. Screening for malignant neoplasm of breast  Normal clinical breast exam    4. Vaginal discharge  -     NUSWAB VAGINITIS PLUS    5. Screening for lipid disorders  -     LIPID PANEL    6. Acute pain of right knee  Persistent symptoms after rest, OTC pain reliever  Refer for further eval  -     REFERRAL TO ORTHOPEDICS    Other orders  -     CVD REPORT      Follow-up Disposition:  Return in about 1 year (around 02/14/2018).     I have discussed the diagnosis with the patient and the intended plan as seen in the above orders. The patient has received an after-visit summary and questions were answered concerning future plans. Patient conveyed understanding of the plan at the time of the visit.    Angelica Pou, NP

## 2017-05-14 ENCOUNTER — Ambulatory Visit: Admit: 2017-05-14 | Discharge: 2017-05-14 | Payer: PRIVATE HEALTH INSURANCE | Attending: Family | Primary: Family

## 2017-05-14 DIAGNOSIS — R0781 Pleurodynia: Secondary | ICD-10-CM

## 2017-05-14 MED ORDER — NAPROXEN 500 MG TAB
500 mg | ORAL_TABLET | Freq: Two times a day (BID) | ORAL | 1 refills | Status: DC
Start: 2017-05-14 — End: 2017-07-04

## 2017-05-14 NOTE — Progress Notes (Signed)
Chief Complaint   Patient presents with   ??? Chest Pain     right side     Pt states she has been having right sided sharp chest pain for about a week.    The pain is not consistent, but last for up to 15 min at a time when it occurs.     Pt reports being awaken out of sleep during last episode of pain, which made her come in today.

## 2017-05-14 NOTE — Progress Notes (Signed)
Barbara Guzman is a 26 y.o. female who presents with the following complaints:  Chief Complaint   Patient presents with   ??? Chest Pain     right side       Subjective:    HPI:   C/o 1 week hx of right lower rib/chest wall pain, aggravated by movement. Pain is intermittent, has noted no pattern to symptoms. Pain awakened her during the night last night and prompted her to come in today.  Works out twice a day, has been doing a lot of kickboxing workouts in the past 2 weeks.     Pertinent PMH/FH/SH:  History reviewed. No pertinent past medical history.  History reviewed. No pertinent surgical history.  Family History   Problem Relation Age of Onset   ??? No Known Problems Mother      Social History     Social History   ??? Marital status: SINGLE     Spouse name: N/A   ??? Number of children: N/A   ??? Years of education: N/A     Social History Main Topics   ??? Smoking status: Former Smoker     Types: Cigarettes     Quit date: 2018   ??? Smokeless tobacco: Never Used   ??? Alcohol use Yes      Comment: socially   ??? Drug use: No   ??? Sexual activity: No     Other Topics Concern   ??? None     Social History Narrative     Advanced Directives: N      There are no active problems to display for this patient.      Nurse notes were reviewed and are correct  Review of Systems - negative except as listed above in the HPI    Objective:     Vitals:    05/14/17 1430   BP: 100/61   Pulse: 67   Resp: 19   Temp: 98.5 ??F (36.9 ??C)   TempSrc: Oral   SpO2: 97%   Weight: 145 lb 4.8 oz (65.9 kg)   Height: 5' (1.524 m)     Physical Examination: General appearance - alert, well appearing, and in no distress, oriented to person, place, and time, normal appearing weight and well hydrated  Mental status - normal mood, behavior, speech, dress, motor activity, and thought processes  Neck - supple, no significant adenopathy  Chest - clear to auscultation, no wheezes, rales or rhonchi, symmetric air entry, chest wall tenderness noted right lower   Heart - normal rate, regular rhythm, normal S1, S2, no murmurs, rubs, clicks or gallops, no JVD  Abdomen - soft, nontender, nondistended, no masses or organomegaly  bowel sounds normal  Neurological - alert, oriented, normal speech, no focal findings or movement disorder noted  Extremities - no pedal edema noted  Skin - normal coloration and turgor, no rashes, no suspicious skin lesions noted    EKG normal    Assessment/ Plan:   Diagnoses and all orders for this visit:    1. Rib pain on right side  2. Intercostal muscle strain, initial encounter  Suspect r/t kickboxing routine  Add rx  Rest x 1 week  -     XR CHEST PA LAT; Future  -     naproxen (NAPROSYN) 500 mg tablet; Take 1 Tab by mouth two (2) times daily (with meals).  -     AMB POC EKG ROUTINE W/ 12 LEADS, INTER & REP    3. Chest pain  in adult  -     AMB POC EKG ROUTINE W/ 12 LEADS, INTER & REP     Follow-up Disposition:  Return if symptoms worsen or fail to improve.    I have discussed the diagnosis with the patient and the intended plan as seen in the above orders.  The patient has received an after-visit summary and questions were answered concerning future plans. The patient verbalizes understanding.    Medication Side Effects and Warnings were discussed with patient: yes  Patient Labs were reviewed and or requested: no  Patient Past Records were reviewed and or requested: yes          Kristopher Oppenheim FNP

## 2017-07-03 ENCOUNTER — Encounter: Attending: Family | Primary: Family

## 2017-07-04 ENCOUNTER — Ambulatory Visit: Admit: 2017-07-04 | Discharge: 2017-07-04 | Payer: PRIVATE HEALTH INSURANCE | Attending: Family | Primary: Family

## 2017-07-04 DIAGNOSIS — M25532 Pain in left wrist: Secondary | ICD-10-CM

## 2017-07-04 MED ORDER — NAPROXEN 500 MG TAB
500 mg | ORAL_TABLET | Freq: Two times a day (BID) | ORAL | 0 refills | Status: AC
Start: 2017-07-04 — End: 2017-07-14

## 2017-07-04 NOTE — Progress Notes (Signed)
Barbara Guzman is a 26 y.o. female who presents with the following complaints:  Chief Complaint   Patient presents with   ??? Jaw Pain   ??? Wrist Pain     left wrist   ??? Skin Problem     buttocks       Subjective:    HPI:   C/o 4 day hx of left jaw and ear pain, gradually improving. Symptoms began after she was hit in the upper jaw/side of face during boxing match. Just after impact she noted ringing in left ear, pain with opening mouth, headache, and feeling dazed. She noted she did not lose consciousness. The slowed thought processes and headache dissipated in about an hour and have not returned . Denies blurred vision, balance disturbance, memory disturbance.   C/o left wrist pain, aggravated by sparring/punching. Pain improves with time off/rest from boxing. No numbness or tingling in fingers. No weakness in grip.   C/o of "bump" on right buttock that she noted after shaving 2-3 days ago. It was tender but has improved today. She tried poking it with a needle yesterday but nothing drained out.     Pertinent PMH/FH/SH:  History reviewed. No pertinent past medical history.  History reviewed. No pertinent surgical history.  Family History   Problem Relation Age of Onset   ??? No Known Problems Mother      Social History     Social History   ??? Marital status: SINGLE     Spouse name: N/A   ??? Number of children: N/A   ??? Years of education: N/A     Social History Main Topics   ??? Smoking status: Former Smoker     Types: Cigarettes     Quit date: 2018   ??? Smokeless tobacco: Never Used   ??? Alcohol use Yes      Comment: socially   ??? Drug use: No   ??? Sexual activity: No     Other Topics Concern   ??? None     Social History Narrative     Advanced Directives: N      There are no active problems to display for this patient.      Nurse notes were reviewed and are correct  Review of Systems - negative except as listed above in the HPI    Objective:     Vitals:    07/04/17 1403   BP: 114/77   Pulse: 73   Resp: 18    Temp: 98.8 ??F (37.1 ??C)   TempSrc: Oral   SpO2: 99%   Weight: 142 lb (64.4 kg)   Height: 5' (1.524 m)     Physical Examination: General appearance - alert, well appearing, and in no distress, oriented to person, place, and time, normal appearing weight and well hydrated  Mental status - normal mood, behavior, speech, dress, motor activity, and thought processes  Eyes - pupils equal and reactive, extraocular eye movements intact  Ears - bilateral TM's and external ear canals normal  Mild tenderness over the left upper jaw near TMJ; normal jaw excursion without clicking bilaterally  Neck - supple, no significant adenopathy  Chest - clear to auscultation, no wheezes, rales or rhonchi, symmetric air entry  Heart - normal rate, regular rhythm, normal S1, S2, no murmurs, rubs, clicks or gallops  Abdomen - soft, nontender, nondistended, no masses or organomegaly  bowel sounds normal  Neurological - alert, oriented, normal speech, no focal findings or movement disorder noted  Extremities -  no pedal edema noted  Skin - normal coloration and turgor, no rashes, no suspicious skin lesions noted  Musculoskeletal - left wrist FROM, minimal tenderness radial aspect, no erythema, no edema, no ecchymosis  Small minimally tender nodule left labia    Assessment/ Plan:   Diagnoses and all orders for this visit:    1. Left wrist pain  Rest  Ice  Add rx  -     naproxen (NAPROSYN) 500 mg tablet; Take 1 Tab by mouth two (2) times daily (with meals) for 10 days.    2. Jaw pain, non-TMJ  Add rx  -     naproxen (NAPROSYN) 500 mg tablet; Take 1 Tab by mouth two (2) times daily (with meals) for 10 days.    3. Folliculitis  Warm water soaks  Avoid shaving until resolved    4. Contusion of jaw, initial encounter  Ice   Naproxen    5. Tinnitus of left ear  resolved     Follow-up Disposition:  Return if symptoms worsen or fail to improve.    I have discussed the diagnosis with the patient and the intended plan as  seen in the above orders.  The patient has received an after-visit summary and questions were answered concerning future plans. The patient verbalizes understanding.    Medication Side Effects and Warnings were discussed with patient: yes  Patient Labs were reviewed and or requested: no  Patient Past Records were reviewed and or requested: yes    Patient Instructions          Folliculitis: Care Instructions  Your Care Instructions    Folliculitis (say "fuh-LIK-yuh-LY-tus") is an infection of the pouches (follicles) in the skin where hair grows. It can occur on any part of the body, but it is most common on the scalp, face, armpits, and groin. Bacteria, such as those found in a hot tub, can cause folliculitis.  Folliculitis begins as a red, tender area near a strand of hair. The skin can itch or burn and may drain pus or blood. Sometimes folliculitis can lead to more serious skin infections.  Your doctor usually can treat mild folliculitis with an antibiotic cream or ointment. If you have folliculitis on your scalp, you may use a shampoo that kills bacteria. Antibiotics you take as pills can treat infections deeper in the skin.  For stubborn cases of folliculitis, laser treatment may be an option. Laser treatment uses strong beams of light to destroy the hair follicle. But hair will no longer grow in the treated area.  Follow-up care is a key part of your treatment and safety. Be sure to make and go to all appointments, and call your doctor if you are having problems. It's also a good idea to know your test results and keep a list of the medicines you take.  How can you care for yourself at home?  ?? Take your medicine exactly as prescribed. If your doctor prescribed antibiotics, take them as directed. Do not stop taking them just because you feel better. You need to take the full course of antibiotics.  ?? Use a soap that kills bacteria to wash the infected area. If your scalp  or beard is infected, use a shampoo with selenium or propylene glycol. Be careful. Do not scrub too long or too hard.  ?? Mix 1 1/3 cup warm water and 1 tablespoon vinegar. Soak a cloth in the mixture, and place it over the infected skin until it cools off (usually  5 to 10 minutes). You can do this 3 to 6 times a day.  ?? Do not share your razor, towel, or washcloth. That can spread folliculitis.  ?? Use a new blade in your razor each time you shave to keep from re-infecting your skin.  ?? If you tend to get folliculitis, avoid using hot tubs. They can contain bacteria that cause folliculitis.  When should you call for help?  Call your doctor now or seek immediate medical care if:  ?? ?? You have symptoms of infection, such as:  ?? Increased pain, swelling, warmth, or redness.  ?? Red streaks leading from the area.  ?? Pus draining from the area.  ?? A fever.   ??Watch closely for changes in your health, and be sure to contact your doctor if:  ?? ?? You do not get better as expected.   Where can you learn more?  Go to [employer].  Enter M257 in the search box to learn more about "Folliculitis: Care Instructions."  Current as of: July 13, 2016  Content Version: 11.7  ?? 2006-2018 [employer]. Care instructions adapted under license by [employer] (which disclaims liability or warranty for this information). If you have questions about a medical condition or this instruction, always ask your healthcare professional. [employer] disclaims any warranty or liability for your use of this information.       Head Injury: Care Instructions  Your Care Instructions    Most injuries to the head are minor. Bumps, cuts, and scrapes on the head and face usually heal well and can be treated the same as injuries to other parts of the body.  Although it's rare, once in a while a more serious problem shows up after  you are home. So it's good to be on the lookout for symptoms for a day or two.  Follow-up care is a key part of your treatment and safety. Be sure to make and go to all appointments, and call your doctor if you are having problems. It's also a good idea to know your test results and keep a list of the medicines you take.  How can you care for yourself at home?  ?? Follow your doctor's instructions. He or she will tell you if you need someone to watch you closely for the next 24 hours or longer.  ?? Take it easy for the next few days or more if you are not feeling well.  ?? Ask your doctor when it's okay for you to go back to activities like driving a car, riding a bike, or operating machinery.  When should you call for help?  Call 911 anytime you think you may need emergency care. For example, call if:  ?? ?? You have a seizure.   ?? ?? You passed out (lost consciousness).   ?? ?? You are confused or can't stay awake.   ??Call your doctor now or seek immediate medical care if:  ?? ?? You have new or worse vomiting.   ?? ?? You feel less alert.   ?? ?? You have new weakness or numbness in any part of your body.   ??Watch closely for changes in your health, and be sure to contact your doctor if:  ?? ?? You do not get better as expected.   ?? ?? You have new symptoms, such as headaches, trouble concentrating, or changes in mood.   Where can you learn more?  Go to [employer].  Enter [phone] in  the search box to learn more about "Head Injury: Care Instructions."  Current as of: July 17, 2016  Content Version: 11.7  ?? 2006-2018 [employer]. Care instructions adapted under license by [employer] (which disclaims liability or warranty for this information). If you have questions about a medical condition or this instruction, always ask your healthcare professional. [employer] disclaims any warranty or liability for your use of this information.          Barbara Oppenheim FNP

## 2017-07-04 NOTE — Patient Instructions (Addendum)
Folliculitis: Care Instructions  Your Care Instructions    Folliculitis (say "fuh-LIK-yuh-LY-tus") is an infection of the pouches (follicles) in the skin where hair grows. It can occur on any part of the body, but it is most common on the scalp, face, armpits, and groin. Bacteria, such as those found in a hot tub, can cause folliculitis.  Folliculitis begins as a red, tender area near a strand of hair. The skin can itch or burn and may drain pus or blood. Sometimes folliculitis can lead to more serious skin infections.  Your doctor usually can treat mild folliculitis with an antibiotic cream or ointment. If you have folliculitis on your scalp, you may use a shampoo that kills bacteria. Antibiotics you take as pills can treat infections deeper in the skin.  For stubborn cases of folliculitis, laser treatment may be an option. Laser treatment uses strong beams of light to destroy the hair follicle. But hair will no longer grow in the treated area.  Follow-up care is a key part of your treatment and safety. Be sure to make and go to all appointments, and call your doctor if you are having problems. It's also a good idea to know your test results and keep a list of the medicines you take.  How can you care for yourself at home?  ?? Take your medicine exactly as prescribed. If your doctor prescribed antibiotics, take them as directed. Do not stop taking them just because you feel better. You need to take the full course of antibiotics.  ?? Use a soap that kills bacteria to wash the infected area. If your scalp or beard is infected, use a shampoo with selenium or propylene glycol. Be careful. Do not scrub too long or too hard.  ?? Mix 1 1/3 cup warm water and 1 tablespoon vinegar. Soak a cloth in the mixture, and place it over the infected skin until it cools off (usually 5 to 10 minutes). You can do this 3 to 6 times a day.  ?? Do not share your razor, towel, or washcloth. That can spread folliculitis.   ?? Use a new blade in your razor each time you shave to keep from re-infecting your skin.  ?? If you tend to get folliculitis, avoid using hot tubs. They can contain bacteria that cause folliculitis.  When should you call for help?  Call your doctor now or seek immediate medical care if:  ?? ?? You have symptoms of infection, such as:  ?? Increased pain, swelling, warmth, or redness.  ?? Red streaks leading from the area.  ?? Pus draining from the area.  ?? A fever.   ??Watch closely for changes in your health, and be sure to contact your doctor if:  ?? ?? You do not get better as expected.   Where can you learn more?  Go to [employer].  Enter M257 in the search box to learn more about "Folliculitis: Care Instructions."  Current as of: July 13, 2016  Content Version: 11.7  ?? 2006-2018 [employer]. Care instructions adapted under license by [employer] (which disclaims liability or warranty for this information). If you have questions about a medical condition or this instruction, always ask your healthcare professional. [employer] disclaims any warranty or liability for your use of this information.       Head Injury: Care Instructions  Your Care Instructions    Most injuries to the head are minor. Bumps, cuts, and scrapes on the head and face usually  heal well and can be treated the same as injuries to other parts of the body.  Although it's rare, once in a while a more serious problem shows up after you are home. So it's good to be on the lookout for symptoms for a day or two.  Follow-up care is a key part of your treatment and safety. Be sure to make and go to all appointments, and call your doctor if you are having problems. It's also a good idea to know your test results and keep a list of the medicines you take.  How can you care for yourself at home?  ?? Follow your doctor's instructions. He or she will tell you if you need  someone to watch you closely for the next 24 hours or longer.  ?? Take it easy for the next few days or more if you are not feeling well.  ?? Ask your doctor when it's okay for you to go back to activities like driving a car, riding a bike, or operating machinery.  When should you call for help?  Call 911 anytime you think you may need emergency care. For example, call if:  ?? ?? You have a seizure.   ?? ?? You passed out (lost consciousness).   ?? ?? You are confused or can't stay awake.   ??Call your doctor now or seek immediate medical care if:  ?? ?? You have new or worse vomiting.   ?? ?? You feel less alert.   ?? ?? You have new weakness or numbness in any part of your body.   ??Watch closely for changes in your health, and be sure to contact your doctor if:  ?? ?? You do not get better as expected.   ?? ?? You have new symptoms, such as headaches, trouble concentrating, or changes in mood.   Where can you learn more?  Go to [employer].  Enter [phone] in the search box to learn more about "Head Injury: Care Instructions."  Current as of: July 17, 2016  Content Version: 11.7  ?? 2006-2018 [employer]. Care instructions adapted under license by [employer] (which disclaims liability or warranty for this information). If you have questions about a medical condition or this instruction, always ask your healthcare professional. [employer] disclaims any warranty or liability for your use of this information.

## 2017-07-04 NOTE — Progress Notes (Signed)
Chief Complaint   Patient presents with   ??? Jaw Pain   ??? Wrist Pain     left wrist     Patient in office today for left side jaw pain and left wrist pain that began on Saturday.  Pt states she was boxing and took a hit in the face noted left ear ringing and difficulty chewing.  Pt states sx have improved since Saturday.  Pt states left wrist feels strained. Have not treated with otc.  Pt have c/o of skin problem in buttocks area,stated she shaved one wk ago.  Denies drainage, odor,redness, or swelling from skin area.    1. Have you been to the ER, urgent care clinic since your last visit?  Hospitalized since your last visit?No    2. Have you seen or consulted any other health care providers outside of the [facility] since your last visit?  Include any pap smears or colon screening. No

## 2017-07-10 ENCOUNTER — Ambulatory Visit: Admit: 2017-07-10 | Discharge: 2017-07-10 | Payer: PRIVATE HEALTH INSURANCE | Attending: Family | Primary: Family

## 2017-07-10 DIAGNOSIS — Z0289 Encounter for other administrative examinations: Secondary | ICD-10-CM

## 2017-07-10 NOTE — Progress Notes (Signed)
Patient had physical May 2018, now presents with physical form to participate in boxing.  Discussed with patient new CPE today will likely not be covered by insurance.   We will fill in the form with exam date of 02/2017. If more recent exam is required, she will schedule with retail provider due to affordability.    Angelita InglesGaylen Q Ji Fairburn, NP  07/10/17

## 2017-07-10 NOTE — Progress Notes (Signed)
Chief Complaint   Patient presents with   ??? Sports Physical     Pt needs to have a current sports physical to be able to box.

## 2017-10-01 ENCOUNTER — Ambulatory Visit
Admit: 2017-10-01 | Discharge: 2017-10-01 | Payer: PRIVATE HEALTH INSURANCE | Attending: Family Medicine | Primary: Family

## 2017-10-01 DIAGNOSIS — J111 Influenza due to unidentified influenza virus with other respiratory manifestations: Secondary | ICD-10-CM

## 2017-10-01 LAB — AMB POC RAPID STREP A: Group A Strep Ag: NEGATIVE

## 2017-10-01 LAB — AMB POC SOFIA INFLUENZA A/B TEST
Influenza A Ag POC: POSITIVE Pos/Neg
Influenza B Ag POC: NEGATIVE Pos/Neg

## 2017-10-01 MED ORDER — OSELTAMIVIR PHOSPHATE 75 MG CAP
75 mg | ORAL_CAPSULE | Freq: Two times a day (BID) | ORAL | 0 refills | Status: AC
Start: 2017-10-01 — End: 2017-10-06

## 2017-10-01 NOTE — Progress Notes (Signed)
1. Have you been to the ER, urgent care clinic since your last visit?  Hospitalized since your last visit?No    2. Have you seen or consulted any other health care providers outside of the [facility] since your last visit?  Include any pap smears or colon screening. No     Chief Complaint   Patient presents with   ??? Cough     productive cough x 1 day   ??? Headache   ??? Sore Throat

## 2017-10-01 NOTE — Progress Notes (Signed)
Chief Complaint   Patient presents with   ??? Cough     productive cough x 1 day   ??? Headache   ??? Sore Throat     she is a 26 y.o. year old female who presents for evalution.  Headache, bodyaches and runny nose  Started 1 day ago  She says she has never felt this bad before when she had a cold    Reviewed PmHx,   RxHx, FmHx, SocHx, AllgHx and updated and dated in the chart.    Aspirin yes ____   No____ N/A____    There are no active problems to display for this patient.      Nurse notes were reviewed and copied and are correct  Review of Systems - negative except as listed above in the HPI    Objective:     Vitals:    10/01/17 1408   BP: 131/76   Pulse: 98   Resp: 18   Temp: (!) 101.8 ??F (38.8 ??C)   TempSrc: Oral   SpO2: 95%   Weight: 145 lb (65.8 kg)   Height: 5' (1.524 m)     Physical Examination: General appearance - alert, well appearing, and in no distress  Mental status - alert, oriented to person, place, and time  Ears - bilateral TM's and external ear canals normal  Mouth - mucous membranes moist, pharynx normal without lesions  Chest - clear to auscultation, no wheezes, rales or rhonchi, symmetric air entry, no tachypnea, retractions or cyanosis  Heart - normal rate, regular rhythm, normal S1, S2, no murmurs, rubs, clicks or gallops         Assessment/ Plan:   Diagnoses and all orders for this visit:    1. Influenza  -     oseltamivir (TAMIFLU) 75 mg capsule; Take 1 Cap by mouth two (2) times a day for 5 days.    2. Sore throat  -     AMB POC RAPID STREP A    3. Fever, unspecified fever cause  -     AMB POC SOFIA INFLUENZA A/B TEST       Follow-up Disposition: Not on File    ICD-10-CM ICD-9-CM    1. Influenza J11.1 487.1 oseltamivir (TAMIFLU) 75 mg capsule   2. Sore throat J02.9 462 AMB POC RAPID STREP A   3. Fever, unspecified fever cause R50.9 780.60 AMB POC SOFIA INFLUENZA A/B TEST       I have discussed the diagnosis with the patient and the intended plan as  seen in the above orders.  The patient has received an after-visit summary and questions were answered concerning future plans.     Medication Side Effects and Warnings were discussed with patient: yes  Patient Labs were reviewed and or requested: yes  Patient Past Records were reviewed and or requested: yes        There are no Patient Instructions on file for this visit.    The patient verbalizes understanding and agrees with the plan of care        Patient has the advanced directives booklet to review
